# Patient Record
Sex: Male | Born: 1985 | Race: White | Hispanic: No | Marital: Married | Smoking: Never smoker
Health system: Southern US, Community
[De-identification: ages and names within clinical notes are randomized; demographics above are authoritative.]

## PROBLEM LIST (undated history)

## (undated) DIAGNOSIS — I8390 Asymptomatic varicose veins of unspecified lower extremity: Secondary | ICD-10-CM

## (undated) HISTORY — DX: Asymptomatic varicose veins of unspecified lower extremity: I83.90

---

## 2009-02-25 ENCOUNTER — Emergency Department: Payer: Self-pay | Admitting: Emergency Medicine

## 2009-07-23 ENCOUNTER — Emergency Department: Payer: Self-pay | Admitting: Internal Medicine

## 2011-11-27 ENCOUNTER — Encounter (HOSPITAL_COMMUNITY): Payer: Self-pay | Admitting: Nurse Practitioner

## 2011-11-27 ENCOUNTER — Emergency Department (HOSPITAL_COMMUNITY): Payer: Managed Care, Other (non HMO)

## 2011-11-27 ENCOUNTER — Emergency Department (HOSPITAL_COMMUNITY)
Admission: EM | Admit: 2011-11-27 | Discharge: 2011-11-27 | Disposition: A | Discharge status: Home / Self Care | Payer: Managed Care, Other (non HMO) | Attending: Emergency Medicine | Admitting: Emergency Medicine

## 2011-11-27 DIAGNOSIS — IMO0001 Reserved for inherently not codable concepts without codable children: Secondary | ICD-10-CM | POA: Insufficient documentation

## 2011-11-27 DIAGNOSIS — S5010XA Contusion of unspecified forearm, initial encounter: Secondary | ICD-10-CM | POA: Insufficient documentation

## 2011-11-27 DIAGNOSIS — X503XXA Overexertion from repetitive movements, initial encounter: Secondary | ICD-10-CM | POA: Insufficient documentation

## 2011-11-27 DIAGNOSIS — L03313 Cellulitis of chest wall: Secondary | ICD-10-CM

## 2011-11-27 DIAGNOSIS — L03319 Cellulitis of trunk, unspecified: Secondary | ICD-10-CM | POA: Insufficient documentation

## 2011-11-27 DIAGNOSIS — L02219 Cutaneous abscess of trunk, unspecified: Secondary | ICD-10-CM | POA: Insufficient documentation

## 2011-11-27 MED ORDER — CEPHALEXIN 500 MG PO CAPS: 500.0000 mg | ORAL_CAPSULE | Freq: Four times a day (QID) | ORAL | Status: DC

## 2011-11-27 MED ORDER — HYDROMORPHONE HCL PF 1 MG/ML IJ SOLN
1.0000 mg | Freq: Once | INTRAMUSCULAR | Status: AC
  Administered 2011-11-27: 1 mg via INTRAMUSCULAR
  Filled 2011-11-27: qty 1

## 2011-11-27 MED ORDER — OXYCODONE-ACETAMINOPHEN 5-325 MG PO TABS: 1.0000 | ORAL_TABLET | Freq: Four times a day (QID) | ORAL | Status: DC | PRN

## 2011-11-27 NOTE — ED Notes (Signed)
Pt alert and oriented, with steady gait at time of discharge. Pt given discharge papers and papers explained. All questions answered and pt walked to discharge.  

## 2011-11-27 NOTE — ED Notes (Addendum)
Pt reports he was moving and a dryer fell onto L arm, swollen knot noted to L medial forearm, c/o severe pain. Cms intact. Pt also was recently treated for poison ivy and states it is not going away.

## 2011-11-27 NOTE — ED Provider Notes (Signed)
History  This chart was scribed for [employer]. Rubin Payor, MD by Shari Heritage. The patient was seen in room TR11C/TR11C. Patient's care was started at 1857.     CSN: [id number]  Arrival date & time 11/27/11  0865   First MD Initiated Contact with Patient 11/27/11 1857      Chief Complaint  Patient presents with  . Arm Injury     The history is provided by the patient. No language interpreter was used.    Aaron Molina is a 26 y.o. male who presents to the Emergency Department complaining of sharp, moderate to severe, constant left forearm pain with a protruding mass and associated numbness onset 1-2 hours ago. Patient states that he was helping his friend move a dryer when he injured himself.   Patient also developed a rash to his right forearm 1-2 weeks ago and was given a 1 week course of steroids. Patient states that he then developed cellulitis to his chest and arms bilaterally. Patient says that he ended 1 week course of Cephalexin for treatment of cellulitis yesterday.   History reviewed. No pertinent past medical history.  History reviewed. No pertinent past surgical history.  History reviewed. No pertinent family history.  History  Substance Use Topics  . Smoking status: Never Smoker   . Smokeless tobacco: Not on file  . Alcohol Use: Yes     rare      Review of Systems  Musculoskeletal: Positive for myalgias.  Skin: Positive for pallor.  All other systems reviewed and are negative.    Allergies  Review of patient's allergies indicates no known allergies.  Home Medications   Current Outpatient Rx  Name Route Sig Dispense Refill  . CEPHALEXIN 500 MG PO CAPS Oral Take 1 capsule (500 mg total) by mouth 4 (four) times daily. 20 capsule 0  . OXYCODONE-ACETAMINOPHEN 5-325 MG PO TABS Oral Take 1-2 tablets by mouth every 6 (six) hours as needed for pain. 15 tablet 0    BP 160/96  Pulse 85  Temp 98.3 F (36.8 C) (Oral)  Resp 16  SpO2 98%  Physical Exam    Constitutional: He is oriented to person, place, and time. He appears well-developed and well-nourished.  HENT:  Head: Normocephalic and atraumatic.  Cardiovascular: Normal rate and regular rhythm.   Pulmonary/Chest: Effort normal and breath sounds normal.       On anterior chest wall there are a few small areas of healing cellulitis.  Abdominal: There is no tenderness.  Musculoskeletal: Normal range of motion. He exhibits tenderness.       5 cm area of swelling to the left forearm that is tender with hematoma. There is no flex with flexion of the hand or wrist.  Neurological: He is alert and oriented to person, place, and time.  Skin: Skin is warm and dry.  Psychiatric: He has a normal mood and affect. His behavior is normal.    ED Course  Procedures (including critical care time) COORDINATION OF CARE: 7:09pm- Patient informed of current plan for treatment and evaluation and agrees with plan at this time.  Labs Reviewed - No data to display  Dg Forearm Left  11/27/2011  *RADIOLOGY REPORT*  Clinical Data: Arm injury  LEFT FOREARM - 2 VIEW  Comparison: None.  Findings: Two views of the left forearm submitted. No acute fracture or subluxation.  There is soft tissue swelling and probable subcutaneous hematoma mid forearm region.  IMPRESSION: No acute fracture or subluxation. Soft tissue swelling probable  subcutaneous hematoma mid forearm region.   Original Report Authenticated By: Natasha Mead, M.D.      1. Traumatic hematoma of forearm   2. Cellulitis of chest wall       MDM  Patient with crush injury of left forearm. There is hematoma. It is less likely a torn muscle. It does not contract with flexion or extension the forearm or fingers. Without much improvement will need to follow with hand surgery. Also has had a return of redness to his chest. He was treated for poison oak and then had cellulitic superinfection. The redness is just started to return after finishing antibiotics  yesterday. He'll be given of course of 4 more days.      I personally performed the services described in this documentation, which was scribed in my presence. The recorded information has been reviewed and considered.     Juliet Rude. Rubin Payor, MD 11/27/11 2002

## 2011-12-04 ENCOUNTER — Emergency Department (HOSPITAL_COMMUNITY): Payer: Managed Care, Other (non HMO)

## 2011-12-04 ENCOUNTER — Encounter (HOSPITAL_COMMUNITY): Payer: Self-pay | Admitting: *Deleted

## 2011-12-04 ENCOUNTER — Emergency Department (HOSPITAL_COMMUNITY)
Admission: EM | Admit: 2011-12-04 | Discharge: 2011-12-04 | Disposition: A | Discharge status: Home / Self Care | Payer: Managed Care, Other (non HMO) | Attending: Emergency Medicine | Admitting: Emergency Medicine

## 2011-12-04 DIAGNOSIS — X58XXXA Exposure to other specified factors, initial encounter: Secondary | ICD-10-CM | POA: Insufficient documentation

## 2011-12-04 DIAGNOSIS — S5010XA Contusion of unspecified forearm, initial encounter: Secondary | ICD-10-CM | POA: Insufficient documentation

## 2011-12-04 DIAGNOSIS — S40029A Contusion of unspecified upper arm, initial encounter: Secondary | ICD-10-CM

## 2011-12-04 LAB — POCT I-STAT, CHEM 8
Chloride: 105 mEq/L (ref 96–112)
HCT: 45 % (ref 39.0–52.0)
Potassium: 4.1 mEq/L (ref 3.5–5.1)
Sodium: 142 mEq/L (ref 135–145)

## 2011-12-04 LAB — CBC WITH DIFFERENTIAL/PLATELET
Basophils Absolute: 0 10*3/uL (ref 0.0–0.1)
Basophils Relative: 1 % (ref 0–1)
MCHC: 35.4 g/dL (ref 30.0–36.0)
Neutro Abs: 3.9 10*3/uL (ref 1.7–7.7)
Neutrophils Relative %: 61 % (ref 43–77)
RDW: 12.3 % (ref 11.5–15.5)

## 2011-12-04 MED ORDER — NAPROXEN 500 MG PO TABS: 500.0000 mg | ORAL_TABLET | Freq: Two times a day (BID) | ORAL | Status: AC

## 2011-12-04 NOTE — ED Notes (Signed)
MD at bedside. 

## 2011-12-04 NOTE — ED Notes (Signed)
New onset of cp since yesterday; inc. Lt. fa swelling. Here last Friday for treatment. Pain from lt. Arm radiating to lt. Side of chest. Had fever this week.

## 2011-12-04 NOTE — ED Notes (Addendum)
I-stat Chem 8  Na        142 K          4.1 Cl         105 ICa        1.27 TCO2    25  Glu       100 BUN      16 Creat     1.1 Hct        45% Hgb       15.3  Anion Gap 16

## 2011-12-04 NOTE — ED Provider Notes (Signed)
History     CSN: [id number] Arrival date & time 12/04/11  4098 First MD Initiated Contact with Patient 12/04/11 1152      Chief Complaint  Patient presents with  . Arm Swelling  . Chest Pain    HPI The patient head injury to his left forearm over a week ago. At that time he developed a hematoma. Patient was referred 2 the hand surgeon. He has seen him in followup and is scheduled to see him again on Monday. The surgeon recommended conservative treatment without any operative intervention on his hematoma. Patient states he has issues with chest pain for an extended period of time. It seems to come and go. There is no particular inciting factors. Patient states at times he also feels short of breath but does not feel short of breath now and is not currently having any chest pain.  Patient states he primarily came in today because he was worried about hematoma. She was concerned that it is going to get infected he wants to know if it should be drained. Patient denies any pleuritic chest discomfort. History reviewed. No pertinent past medical history.  History reviewed. No pertinent past surgical history.  No family history on file.  History  Substance Use Topics  . Smoking status: Never Smoker   . Smokeless tobacco: Not on file  . Alcohol Use: Yes     rare      Review of Systems  All other systems reviewed and are negative.    Allergies  Review of patient's allergies indicates no known allergies.  Home Medications   Current Outpatient Rx  Name Route Sig Dispense Refill  . ASPIRIN-ACETAMINOPHEN-CAFFEINE 250-250-65 MG PO TABS Oral Take 2 tablets by mouth every 6 (six) hours as needed. As needed for pain.      BP 137/96  Pulse 72  Temp 98.6 F (37 C) (Oral)  Resp 14  SpO2 100%  Physical Exam  Nursing note and vitals reviewed. Constitutional: He appears well-developed and well-nourished. No distress.  HENT:  Head: Normocephalic and atraumatic.  Right Ear: External  ear normal.  Left Ear: External ear normal.  Eyes: Conjunctivae normal are normal. Right eye exhibits no discharge. Left eye exhibits no discharge. No scleral icterus.  Neck: Neck supple. No tracheal deviation present.  Cardiovascular: Normal rate, regular rhythm and intact distal pulses.   Pulmonary/Chest: Effort normal and breath sounds normal. No stridor. No respiratory distress. He has no wheezes. He has no rales.  Abdominal: Soft. Bowel sounds are normal. He exhibits no distension. There is no tenderness. There is no rebound and no guarding.  Musculoskeletal: He exhibits no edema and no tenderness.       Left forearm: He exhibits swelling. He exhibits no bony tenderness.       Hematoma left mid forearm, soft, no erythema or induration  Neurological: He is alert. He has normal strength. No sensory deficit. Cranial nerve deficit:  no gross defecits noted. He exhibits normal muscle tone. He displays no seizure activity. Coordination normal.  Skin: Skin is warm and dry. No rash noted.  Psychiatric: He has a normal mood and affect.    ED Course  Procedures (including critical care time)  Rate: 69  Rhythm: normal sinus rhythm  QRS Axis: normal  Intervals: normal  ST/T Wave abnormalities: normal  Conduction Disutrbances:none  Narrative Interpretation: q waves noted inferior and lateral  Old EKG Reviewed: none available   Labs Reviewed  CBC WITH DIFFERENTIAL   Dg Chest 2  View  12/04/2011  *RADIOLOGY REPORT*  Clinical Data: Chest pain,  arm swelling  CHEST - 2 VIEW  Comparison: None.  Findings: Cardiomediastinal silhouette is unremarkable.  No acute infiltrate or pleural effusion.  No pulmonary edema.  IMPRESSION: No active disease.   Original Report Authenticated By: Natasha Mead, M.D.      MDM  The patient has persistent hematoma in his forearm. He has no other swelling to suggest a venous thrombosis. Patient is low risk for cardiac disease. The symptoms do not sound to be cardiac in  nature. Patient is low risk for PE and is per negative. I reassured the patient that there does not appear to be any serious cause of his symptoms. It is reasonable followup with a primary care doctor for a full physical to discuss the episodes of chest pain he said over the years. Patient does have plans to followup with the surgeon on Monday to discuss whether drainage would be indicated but at this time there is no emergent need for that.        Celene Kras, MD 12/04/11 [phone]

## 2012-10-30 ENCOUNTER — Emergency Department: Payer: Self-pay | Admitting: Emergency Medicine

## 2012-10-31 LAB — URINALYSIS, COMPLETE
Bacteria: NONE SEEN
Bilirubin,UR: NEGATIVE
Blood: NEGATIVE
Glucose,UR: NEGATIVE mg/dL (ref 0–75)
Ketone: NEGATIVE
Leukocyte Esterase: NEGATIVE
Ph: 5 (ref 4.5–8.0)
Protein: NEGATIVE
RBC,UR: 1 /HPF (ref 0–5)
Specific Gravity: 1.015 (ref 1.003–1.030)
WBC UR: <1 /HPF (ref 0–5)

## 2012-10-31 LAB — BASIC METABOLIC PANEL
EGFR (African American): >60
EGFR (Non-African Amer.): >60
Glucose: 94 mg/dL (ref 65–99)
Osmolality: 273 (ref 275–301)
Sodium: 137 mmol/L (ref 136–145)

## 2012-10-31 LAB — CBC
HCT: 41.4 % (ref 40.0–52.0)
HGB: 14.4 g/dL (ref 13.0–18.0)
Platelet: 201 10*3/uL (ref 150–440)
RBC: 4.99 10*6/uL (ref 4.40–5.90)
RDW: 12.9 % (ref 11.5–14.5)

## 2012-10-31 LAB — DIFFERENTIAL
Basophil %: 0.6 %
Eosinophil %: 1.8 %
Monocyte #: 0.9 x10 3/mm (ref 0.2–1.0)

## 2012-10-31 LAB — SEDIMENTATION RATE: Erythrocyte Sed Rate: 4 mm/hr (ref 0–15)

## 2012-10-31 LAB — MONONUCLEOSIS SCREEN: Mono Test: NEGATIVE

## 2012-10-31 LAB — HEPATIC FUNCTION PANEL A (ARMC)
Albumin: 4.3 g/dL (ref 3.4–5.0)
Alkaline Phosphatase: 87 U/L (ref 50–136)
SGOT(AST): 70 U/L — ABNORMAL HIGH (ref 15–37)

## 2012-11-01 LAB — URINE CULTURE

## 2012-11-02 LAB — BETA STREP CULTURE(ARMC)

## 2012-11-05 LAB — CULTURE, BLOOD (SINGLE)

## 2013-01-05 ENCOUNTER — Other Ambulatory Visit: Payer: Self-pay | Admitting: *Deleted

## 2013-01-05 DIAGNOSIS — I83893 Varicose veins of bilateral lower extremities with other complications: Secondary | ICD-10-CM

## 2013-02-09 ENCOUNTER — Encounter: Payer: Managed Care, Other (non HMO) | Admitting: Vascular Surgery

## 2013-02-09 ENCOUNTER — Encounter (HOSPITAL_COMMUNITY): Payer: Managed Care, Other (non HMO)

## 2013-03-29 ENCOUNTER — Encounter: Payer: Self-pay | Admitting: Vascular Surgery

## 2013-03-30 ENCOUNTER — Encounter: Payer: Managed Care, Other (non HMO) | Admitting: Vascular Surgery

## 2013-03-30 ENCOUNTER — Encounter (HOSPITAL_COMMUNITY): Payer: Managed Care, Other (non HMO)

## 2013-05-01 ENCOUNTER — Encounter: Payer: Self-pay | Admitting: Vascular Surgery

## 2013-05-02 ENCOUNTER — Encounter: Payer: Self-pay | Admitting: Vascular Surgery

## 2013-05-02 ENCOUNTER — Ambulatory Visit (HOSPITAL_COMMUNITY)
Admission: RE | Admit: 2013-05-02 | Discharge: 2013-05-02 | Disposition: A | Discharge status: Home / Self Care | Payer: Managed Care, Other (non HMO) | Source: Ambulatory Visit | Attending: Vascular Surgery | Admitting: Vascular Surgery

## 2013-05-02 ENCOUNTER — Ambulatory Visit (INDEPENDENT_AMBULATORY_CARE_PROVIDER_SITE_OTHER): Payer: Managed Care, Other (non HMO) | Admitting: Vascular Surgery

## 2013-05-02 VITALS — BP 141/85 | HR 90 | Resp 16 | Ht 76.0 in | Wt 260.0 lb

## 2013-05-02 DIAGNOSIS — I83893 Varicose veins of bilateral lower extremities with other complications: Secondary | ICD-10-CM | POA: Insufficient documentation

## 2013-05-02 DIAGNOSIS — I8390 Asymptomatic varicose veins of unspecified lower extremity: Secondary | ICD-10-CM | POA: Insufficient documentation

## 2013-05-02 NOTE — Progress Notes (Signed)
Subjective:     Patient ID: Aaron Molina, male   DOB: [birth year], 28 y.o.   MRN: [id number]  HPI this 28 year old male was referred for varicose veins in the left leg. He has had these for 10 years. He did suffer a traumatic injury to his left lower leg 10 years ago about that time he started. He has no history of DVT, thrombophlebitis, stasis ulcers, or bleeding. He does have some mild chronic edema in the left leg. The veins have become much more prominent over the last few years and caused aching throbbing and burning discomfort as the day progresses. He works on his feet all day. Using our elastic compression stockings.  Past Medical History  Diagnosis Date  . Varicose veins     History  Substance Use Topics  . Smoking status: Never Smoker   . Smokeless tobacco: Not on file  . Alcohol Use: Yes     Comment: rare    History reviewed. No pertinent family history.  No Known Allergies  Current outpatient prescriptions:aspirin-acetaminophen-caffeine (EXCEDRIN MIGRAINE) 250-250-65 MG per tablet, Take 2 tablets by mouth every 6 (six) hours as needed. As needed for pain., Disp: , Rfl: ;  naproxen (NAPROSYN) 500 MG tablet, Take 1 tablet (500 mg total) by mouth 2 (two) times daily., Disp: 30 tablet, Rfl: 0  BP 141/85  Pulse 90  Resp 16  Ht 6\' 4"  (1.93 m)  Wt 260 lb (117.935 kg)  BMI 31.66 kg/m2  Body mass index is 31.66 kg/(m^2).           Review of Systems denies chest pain, dyspnea on exertion, PND, orthopnea, hemoptysis, claudication, chronic back pain, all systems negative and a complete review of systems except those in the history and present illness     Objective:   Physical Exam BP 141/85  Pulse 90  Resp 16  Ht 6\' 4"  (1.93 m)  Wt 260 lb (117.935 kg)  BMI 31.66 kg/m2  Gen.-alert and oriented x3 in no apparent distress HEENT normal for age Lungs no rhonchi or wheezing Cardiovascular regular rhythm no murmurs carotid pulses 3+ palpable no bruits audible Abdomen  soft nontender no palpable masses Musculoskeletal free of  major deformities Skin clear -no rashes Neurologic normal Lower extremities 3+ femoral and dorsalis pedis pulses palpable bilaterally with no edema on the right Left leg has prominent bulging varicosities beginning at the knee and pretibial region anteriorly extending laterally and posterior calf down near the ankle. There is no hyperpigmentation or ulceration. There is mild edema distally. There are no medial para-Costley is associated with a great saphenous system.  Today I ordered a venous duplex exam the left leg which are reviewed and interpreted. There is no reflux in the small or great saphenous systems and there is no DVT. There is reflux in the deep system on the left leg in the common femoral superficial femoral and popliteal veins. No incompetent perforators are identified.        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 He is a 28 year old who works on his feet all day and has increasing symptomatology as the day progresses.    Plan:     #1 long-leg elastic compression stockings 20-30 mm gradient #2 elevate legs as much as possible-unable to do this during work  hours #3 ibuprofen for pain #4 return in 3 months. If no significant improvement patient needs multiple stab phlebectomy-greater than 20 left leg to treat these painful varicosities.

## 2013-07-28 ENCOUNTER — Encounter: Payer: Self-pay | Admitting: Vascular Surgery

## 2013-08-01 ENCOUNTER — Ambulatory Visit: Payer: Managed Care, Other (non HMO) | Admitting: Vascular Surgery

## 2014-07-14 IMAGING — CR DG FOREARM 2V*L*
2 series · 2 of 2 positions shown · non-contrast
Comparison: None.

CLINICAL DATA: Arm injury

LEFT FOREARM - 2 VIEW

[x forearm ap left]
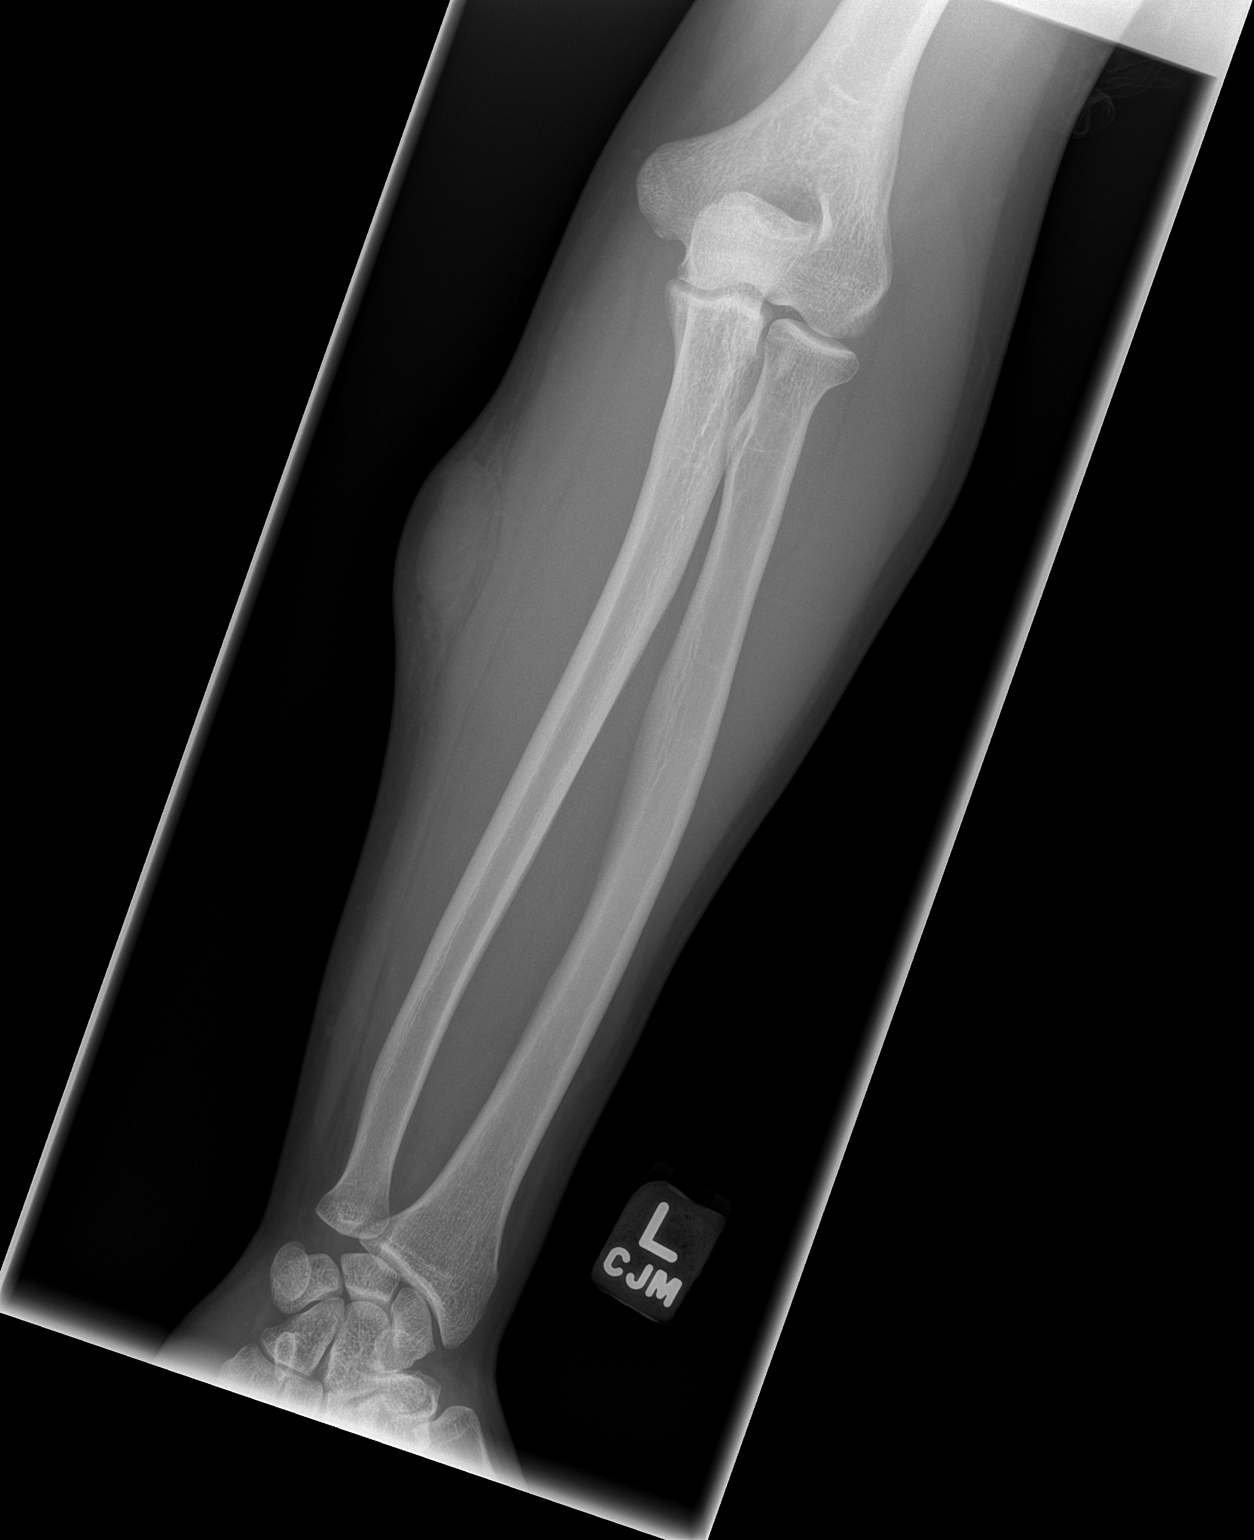

[x forearm lat left]
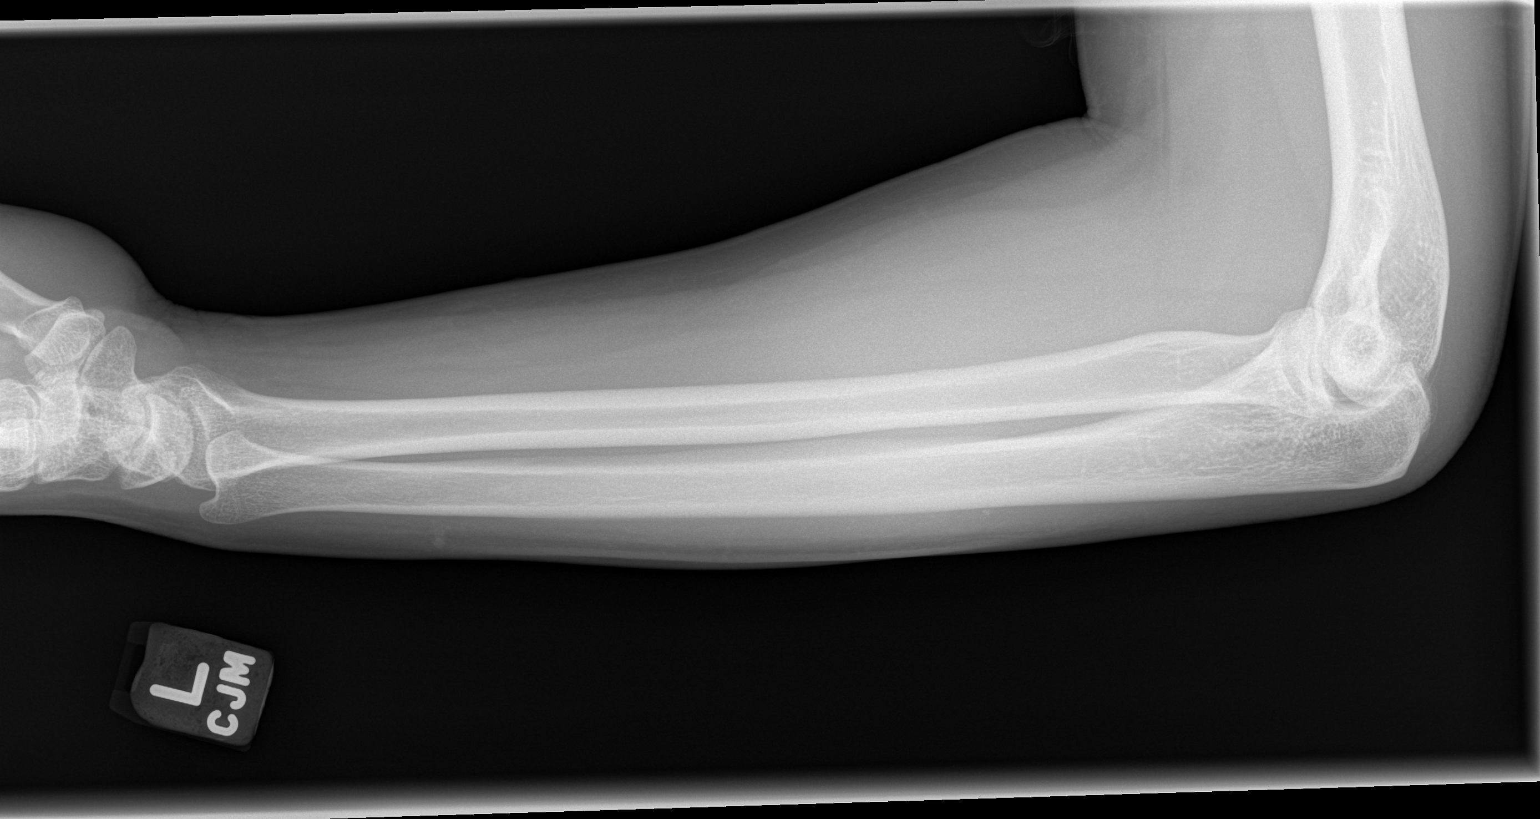

[2 of 2 positions shown; findings below may reference images not displayed]

FINDINGS: Two views of the left forearm submitted.
No acute fracture or subluxation.  There is soft tissue swelling
and probable subcutaneous hematoma mid forearm region.
IMPRESSION: No acute fracture or subluxation.
Soft tissue swelling probable subcutaneous hematoma mid forearm
region.

## 2015-06-18 IMAGING — CR DG CHEST 2V
1 series · 2 of 2 positions shown · non-contrast
Comparison: none

REASON FOR EXAM: FEVER
COMMENTS:   May transport without cardiac monitor

[Series 1: w chest pa · 0.14mm/px · 2 of 2 slices shown]
[im 1/2]
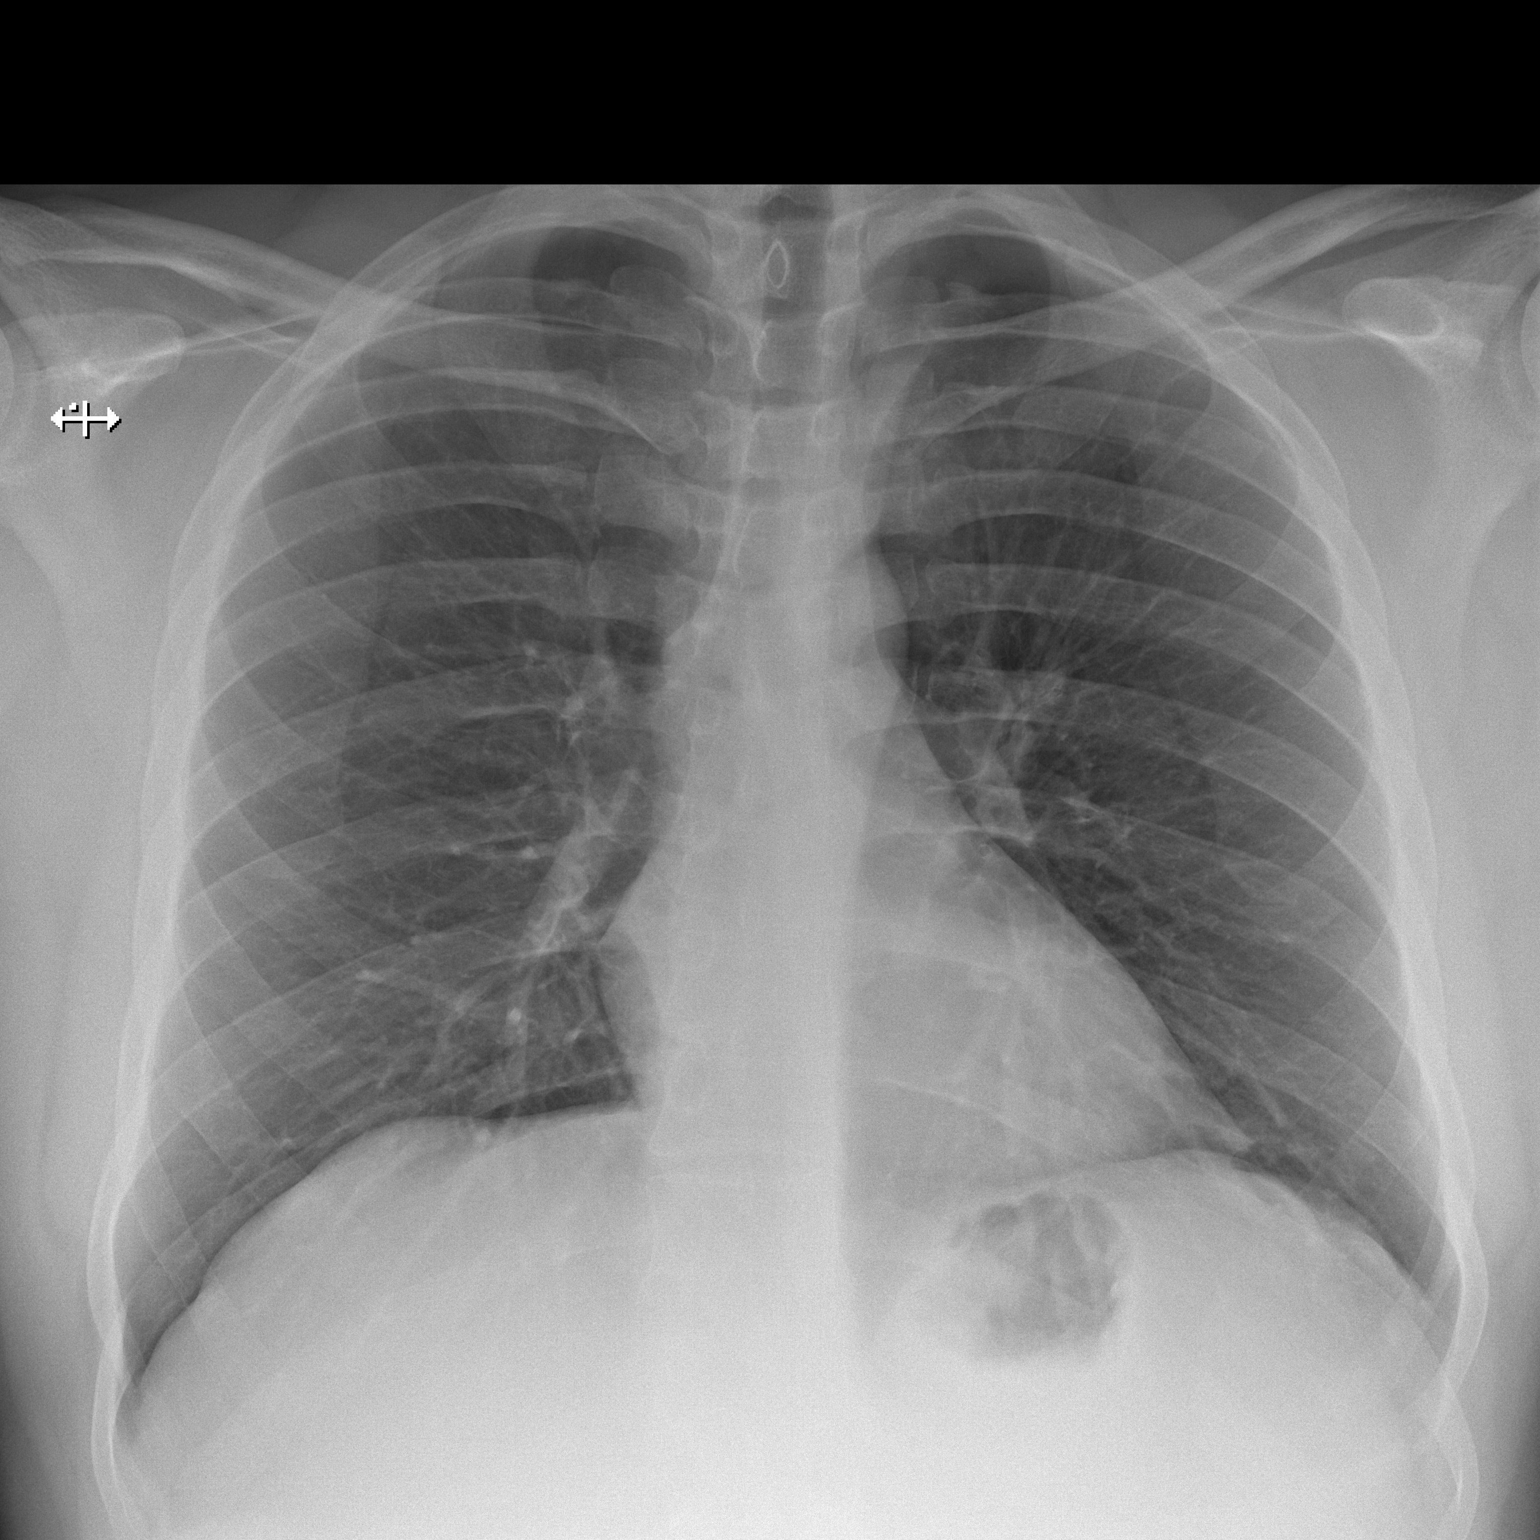
[im 2/2]
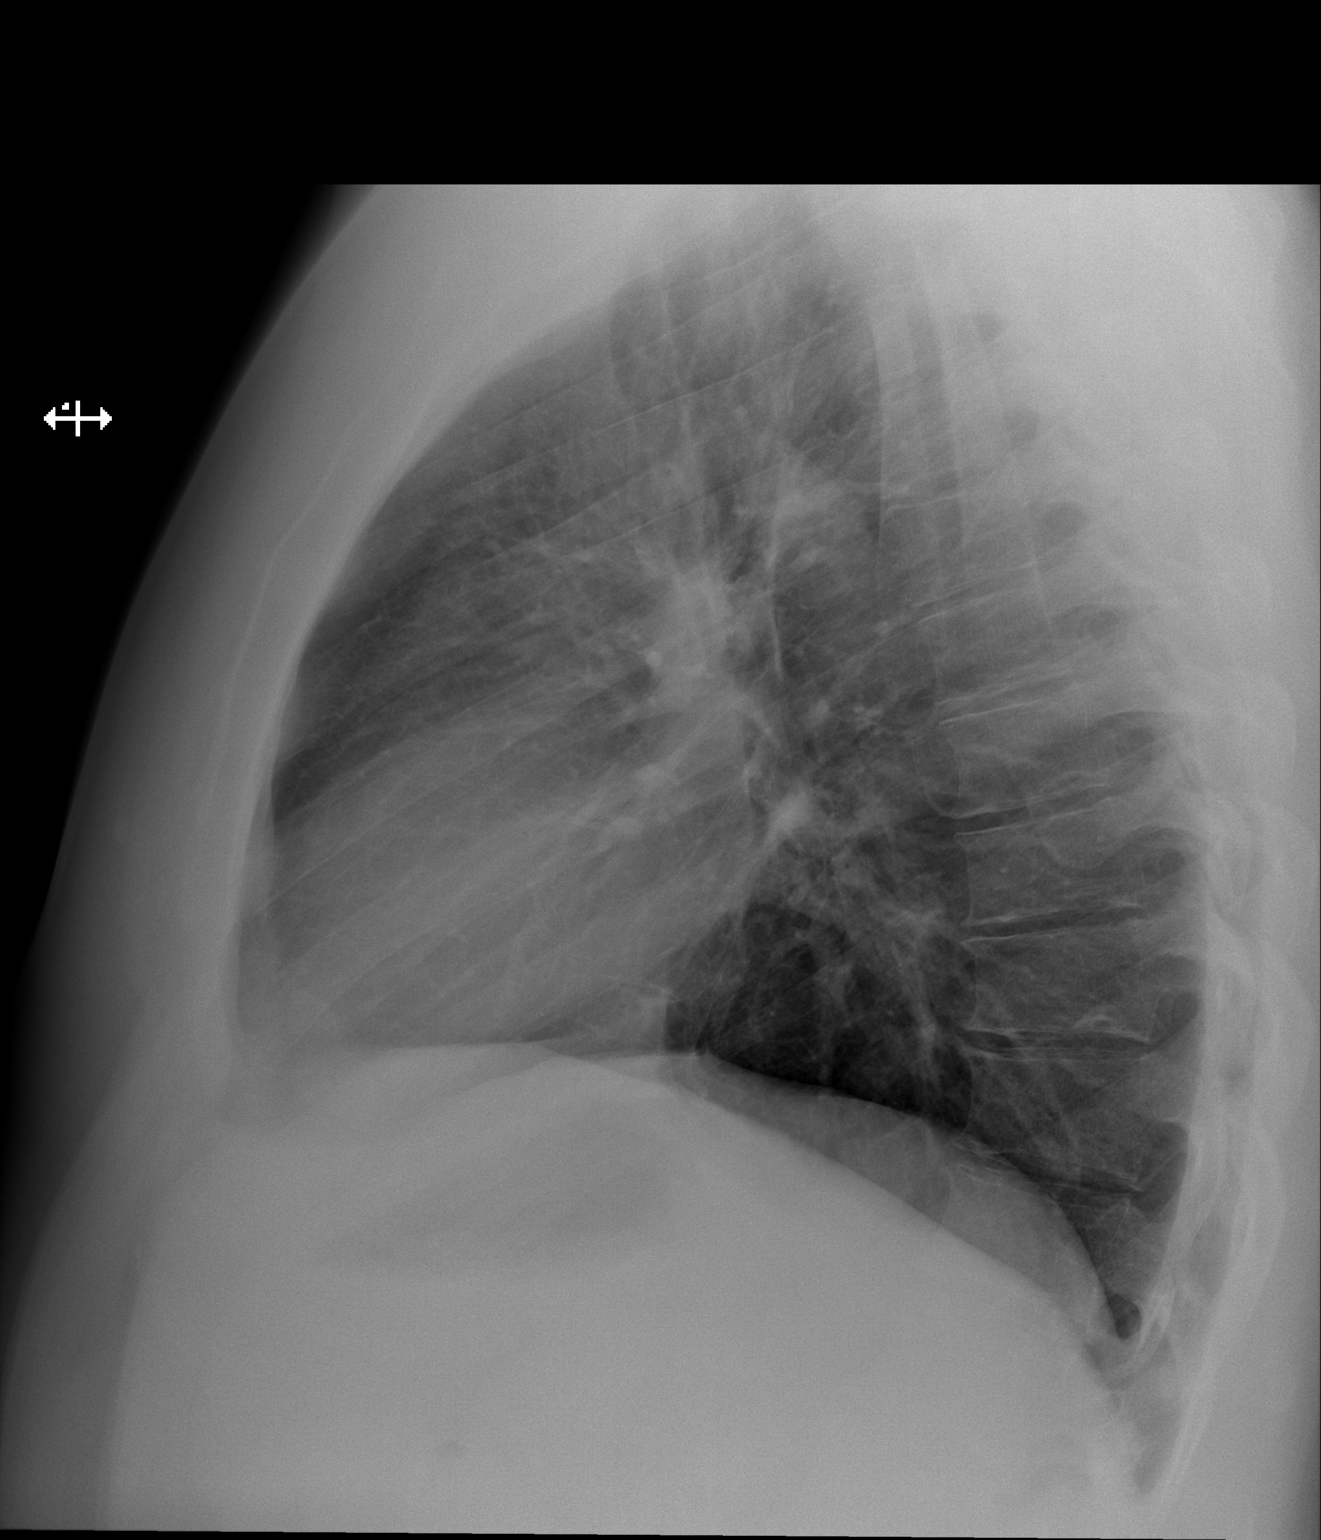

[2 of 2 positions shown; findings below may reference images not displayed]

PROCEDURE:     DXR - DXR CHEST PA (OR AP) AND LATERAL  - October 31, 2012  [DATE]

RESULT:     The lungs are adequately inflated. There is no focal infiltrate.
The cardiac silhouette is normal in size. The mediastinum is normal in
width. There is no pleural effusion. The bony thorax exhibits no acute
abnormality.
IMPRESSION: There is no evidence of acute cardiopulmonary abnormality.

[REDACTED]
